# Patient Record
Sex: Male | Born: 1980 | Race: Black or African American | Marital: Married | Smoking: Never smoker
Health system: Southern US, Community
[De-identification: ages and names within clinical notes are randomized; demographics above are authoritative.]

## PROBLEM LIST (undated history)

## (undated) HISTORY — PX: APPENDECTOMY: SHX54

---

## 2020-02-21 ENCOUNTER — Encounter: Payer: Self-pay | Admitting: Emergency Medicine

## 2020-02-21 ENCOUNTER — Other Ambulatory Visit: Payer: Self-pay

## 2020-02-21 ENCOUNTER — Emergency Department
Admission: EM | Admit: 2020-02-21 | Discharge: 2020-02-21 | Disposition: A | Discharge status: Home / Self Care | Payer: BC Managed Care – PPO | Attending: Emergency Medicine | Admitting: Emergency Medicine

## 2020-02-21 ENCOUNTER — Emergency Department: Payer: BC Managed Care – PPO

## 2020-02-21 DIAGNOSIS — X509XXA Other and unspecified overexertion or strenuous movements or postures, initial encounter: Secondary | ICD-10-CM | POA: Insufficient documentation

## 2020-02-21 DIAGNOSIS — S29011A Strain of muscle and tendon of front wall of thorax, initial encounter: Secondary | ICD-10-CM | POA: Insufficient documentation

## 2020-02-21 DIAGNOSIS — S299XXA Unspecified injury of thorax, initial encounter: Secondary | ICD-10-CM | POA: Diagnosis present

## 2020-02-21 DIAGNOSIS — Y999 Unspecified external cause status: Secondary | ICD-10-CM | POA: Diagnosis not present

## 2020-02-21 DIAGNOSIS — T148XXA Other injury of unspecified body region, initial encounter: Secondary | ICD-10-CM

## 2020-02-21 DIAGNOSIS — Y9389 Activity, other specified: Secondary | ICD-10-CM | POA: Insufficient documentation

## 2020-02-21 DIAGNOSIS — Y929 Unspecified place or not applicable: Secondary | ICD-10-CM | POA: Insufficient documentation

## 2020-02-21 HISTORY — DX: Gilbert syndrome: E80.4

## 2020-02-21 MED ORDER — CYCLOBENZAPRINE HCL 5 MG PO TABS: ORAL_TABLET | ORAL | 0 refills | Status: AC

## 2020-02-21 MED ORDER — KETOROLAC TROMETHAMINE 30 MG/ML IJ SOLN
30.0000 mg | Freq: Once | INTRAMUSCULAR | Status: AC
  Administered 2020-02-21: 30 mg via INTRAMUSCULAR
  Filled 2020-02-21: qty 1

## 2020-02-21 MED ORDER — KETOROLAC TROMETHAMINE 10 MG PO TABS: 10.0000 mg | ORAL_TABLET | Freq: Four times a day (QID) | ORAL | 0 refills | Status: AC | PRN

## 2020-02-21 MED ORDER — LIDOCAINE 5 % EX PTCH: 1.0000 | MEDICATED_PATCH | CUTANEOUS | 0 refills | Status: AC

## 2020-02-21 NOTE — ED Provider Notes (Signed)
[facility] Emergency Department Provider Note  ____________________________________________  Time seen: Approximately 3:13 PM  I have reviewed the triage vital signs and the nursing notes.   HISTORY  Chief Complaint Muscle Pain    HPI Thomas Sanchez is a 39 y.o. male presents to the emergency department for evaluation of right chest wall pain just below his right collarbone for 2 months.  Patient states that he was pulling on a car motor when he felt a pull to his right chest wall.  He is concerned that he pulled the muscle here.  Pain has continued off and on since.  Pain is worse in the morning when he wakes up.  It is also worse when he tries to arch his shoulders backwards or when he turns his neck left or right.  He can elicit pain in the emergency department by pulling his shoulders backwards or by pulling his shoulders forwards.  If he is standing still, he denies any pain.  He has been taking and International cold and flu medication for pain.  He is unsure of the ingredients but states there is no anti-inflammatory in medication.  No shortness of breath,  Past Medical History:  Diagnosis Date  . Gilbert's syndrome     There are no problems to display for this patient.   History reviewed. No pertinent surgical history.  Prior to Admission medications   Medication Sig Start Date End Date Taking? Authorizing Provider  cyclobenzaprine (FLEXERIL) 5 MG tablet Take 1-2 tablets 3 times daily as needed 02/21/20   Enid Derry, PA-C  ketorolac (TORADOL) 10 MG tablet Take 1 tablet (10 mg total) by mouth every 6 (six) hours as needed. 02/21/20   Enid Derry, PA-C  lidocaine (LIDODERM) 5 % Place 1 patch onto the skin daily. Remove & Discard patch within 12 hours or as directed by MD 02/21/20   Enid Derry, PA-C    Allergies Patient has no known allergies.  History reviewed. No pertinent family history.  Social History Social History   Tobacco Use  .  Smoking status: Never Smoker  . Smokeless tobacco: Never Used  Substance Use Topics  . Alcohol use: Not on file  . Drug use: Not on file     Review of Systems  Constitutional: No fever/chills ENT: No upper respiratory complaints. Cardiovascular: Positive for chest wall pain. Respiratory: No cough. No SOB. Gastrointestinal: No abdominal pain.  No nausea, no vomiting.  Musculoskeletal: Negative for musculoskeletal pain. Skin: Negative for rash, abrasions, lacerations, ecchymosis. Neurological: Negative for headaches, numbness or tingling   ____________________________________________   PHYSICAL EXAM:  VITAL SIGNS: ED Triage Vitals  Enc Vitals Group     BP 02/21/20 1338 138/83     Pulse Rate 02/21/20 1338 91     Resp 02/21/20 1338 15     Temp 02/21/20 1338 98.2 F (36.8 C)     Temp Source 02/21/20 1338 Oral     SpO2 02/21/20 1338 99 %     Weight 02/21/20 1338 190 lb (86.2 kg)     Height 02/21/20 1338 6' (1.829 m)     Head Circumference --      Peak Flow --      Pain Score 02/21/20 1353 0     Pain Loc --      Pain Edu? --      Excl. in GC? --      Constitutional: Alert and oriented. Well appearing and in no acute distress. Eyes: Conjunctivae are normal. PERRL.  EOMI. Head: Atraumatic. ENT:      Ears:      Nose: No congestion/rhinnorhea.      Mouth/Throat: Mucous membranes are moist.  Neck: No stridor.  Cardiovascular: Normal rate, regular rhythm.  Good peripheral circulation. Respiratory: Normal respiratory effort without tachypnea or retractions. Lungs CTAB. Good air entry to the bases with no decreased or absent breath sounds. Musculoskeletal: Full range of motion to all extremities. No gross deformities appreciated.  Pain elicited with arching backwards and arching forwards.  Pain elicited with right range of motion of right shoulder.  Pain elicited with rotation of neck. Neurologic:  Normal speech and language. No gross focal neurologic deficits are appreciated.   Skin:  Skin is warm, dry and intact. No rash noted. Psychiatric: Mood and affect are normal. Speech and behavior are normal. Patient exhibits appropriate insight and judgement.   ____________________________________________   LABS (all labs ordered are listed, but only abnormal results are displayed)  Labs Reviewed - No data to display ____________________________________________  EKG   ____________________________________________  RADIOLOGY Lexine Baton, personally viewed and evaluated these images (plain radiographs) as part of my medical decision making, as well as reviewing the written report by the radiologist.  DG Chest 2 View  Result Date: 02/21/2020 CLINICAL DATA:  Chest pain. EXAM: CHEST - 2 VIEW COMPARISON:  None. FINDINGS: The heart size and mediastinal contours are within normal limits. Both lungs are clear. No pneumothorax or pleural effusion is noted. The visualized skeletal structures are unremarkable. IMPRESSION: No active cardiopulmonary disease. Electronically Signed   By: Lupita Raider M.D.   On: 02/21/2020 15:32    ____________________________________________    PROCEDURES  Procedure(s) performed:    Procedures    Medications  ketorolac (TORADOL) 30 MG/ML injection 30 mg (30 mg Intramuscular Given 02/21/20 1628)     ____________________________________________   INITIAL IMPRESSION / ASSESSMENT AND PLAN / ED COURSE  Pertinent labs & imaging results that were available during my care of the patient were reviewed by me and considered in my medical decision making (see chart for details).  Review of the [place] CSRS was performed in accordance of the [facility] prior to dispensing any controlled drugs.   Patient's diagnosis is consistent with musculoskeletal pain. Vital signs and exam are reassuring. Exam is consistent with a muscle strain. Pain is reproducible with movement.  Chest x-ray negative for acute cardiopulmonary processes.  Sinus rhythm on  EKG.  Patient was given IM Toradol for pain. Patient will be discharged home with prescriptions for flexeril and motrin. Patient is to follow up with PCP as directed. Patient is given ED precautions to return to the ED for any worsening or new symptoms.  Thomas Sanchez was evaluated in Emergency Department on 02/21/2020 for the symptoms described in the history of present illness. He was evaluated in the context of the global COVID-19 pandemic, which necessitated consideration that the patient might be at risk for infection with the SARS-CoV-2 virus that causes COVID-19. Institutional protocols and algorithms that pertain to the evaluation of patients at risk for COVID-19 are in a state of rapid change based on information released by regulatory bodies including the CDC and federal and state organizations. These policies and algorithms were followed during the patient's care in the ED.   ____________________________________________  FINAL CLINICAL IMPRESSION(S) / ED DIAGNOSES  Final diagnoses:  Muscle strain      NEW MEDICATIONS STARTED DURING THIS VISIT:  ED Discharge Orders  Ordered    ketorolac (TORADOL) 10 MG tablet  Every 6 hours PRN     02/21/20 1622    cyclobenzaprine (FLEXERIL) 5 MG tablet     02/21/20 1622    lidocaine (LIDODERM) 5 %  Every 24 hours     02/21/20 1622              This chart was dictated using voice recognition software/Dragon. Despite best efforts to proofread, errors can occur which can change the meaning. Any change was purely unintentional.    Laban Emperor, PA-C 02/21/20 2316    Drenda Freeze, MD 02/21/20 [phone]

## 2020-02-21 NOTE — ED Notes (Signed)
See triage note- pt states he strained his right sided pectoral muscle in January. Reports pain with movement.

## 2020-02-21 NOTE — ED Triage Notes (Signed)
Pt c/o neck pain that radiates down when he turns his head, states the also strained right pectoral muscle a few months ago. States the pain comes and goes.

## 2021-06-26 ENCOUNTER — Encounter: Payer: Self-pay | Admitting: Emergency Medicine

## 2021-06-26 ENCOUNTER — Emergency Department: Payer: 59

## 2021-06-26 ENCOUNTER — Other Ambulatory Visit: Payer: Self-pay

## 2021-06-26 DIAGNOSIS — R0789 Other chest pain: Secondary | ICD-10-CM | POA: Diagnosis present

## 2021-06-26 DIAGNOSIS — M25511 Pain in right shoulder: Secondary | ICD-10-CM | POA: Diagnosis not present

## 2021-06-26 LAB — CBC
HCT: 47.5 % (ref 39.0–52.0)
Hemoglobin: 17.4 g/dL — ABNORMAL HIGH (ref 13.0–17.0)
MCH: 29 pg (ref 26.0–34.0)
MCHC: 36.6 g/dL — ABNORMAL HIGH (ref 30.0–36.0)
MCV: 79 fL — ABNORMAL LOW (ref 80.0–100.0)
Platelets: 153 10*3/uL (ref 150–400)
RBC: 6.01 MIL/uL — ABNORMAL HIGH (ref 4.22–5.81)
RDW: 13.1 % (ref 11.5–15.5)
WBC: 6.3 10*3/uL (ref 4.0–10.5)
nRBC: 0 % (ref 0.0–0.2)

## 2021-06-26 LAB — BASIC METABOLIC PANEL
Anion gap: 9 (ref 5–15)
BUN: 10 mg/dL (ref 6–20)
CO2: 25 mmol/L (ref 22–32)
Calcium: 9.2 mg/dL (ref 8.9–10.3)
Chloride: 104 mmol/L (ref 98–111)
Creatinine, Ser: 1.25 mg/dL — ABNORMAL HIGH (ref 0.61–1.24)
GFR, Estimated: >60 mL/min (ref >60)
Glucose, Bld: 114 mg/dL — ABNORMAL HIGH (ref 70–99)
Potassium: 3.3 mmol/L — ABNORMAL LOW (ref 3.5–5.1)
Sodium: 138 mmol/L (ref 135–145)

## 2021-06-26 LAB — TROPONIN I (HIGH SENSITIVITY): Troponin I (High Sensitivity): 3 ng/L (ref <18)

## 2021-06-26 NOTE — ED Triage Notes (Signed)
Patient to ED for CP that started this AM. Patient also complaining on right shoulder pain that comes and goes depending on the posture. Patient ambulatory to triage.

## 2021-06-27 ENCOUNTER — Emergency Department
Admission: EM | Admit: 2021-06-27 | Discharge: 2021-06-27 | Disposition: A | Discharge status: Home / Self Care | Payer: 59 | Attending: Emergency Medicine | Admitting: Emergency Medicine

## 2021-06-27 DIAGNOSIS — R0789 Other chest pain: Secondary | ICD-10-CM

## 2021-06-27 DIAGNOSIS — M25511 Pain in right shoulder: Secondary | ICD-10-CM

## 2021-06-27 LAB — TROPONIN I (HIGH SENSITIVITY): Troponin I (High Sensitivity): 3 ng/L (ref <18)

## 2021-06-27 NOTE — ED Notes (Signed)
No chest pain right now.  No  Distress.  No sob.  Says the chest pain started yesterday

## 2021-06-27 NOTE — ED Provider Notes (Signed)
[facility] Emergency Department Provider Note   ____________________________________________   Event Date/Time   First MD Initiated Contact with Patient 06/27/21 [phone]     (approximate)  I have reviewed the triage vital signs and the nursing notes.   HISTORY  Chief Complaint Chest Pain    HPI Thomas Sanchez is a 40 y.o. 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 MODIFYING FACTORS: Denies any exacerbating or relieving factors ASSOCIATED SYMPTOMS: Intermittent right shoulder pain   Per medical record review, past medical history noncontributory          Past Medical History:  Diagnosis Date   Gilbert's syndrome     There are no problems to display for this patient.   Past Surgical History:  Procedure Laterality Date   APPENDECTOMY      Prior to Admission medications   Medication Sig Start Date End Date Taking? Authorizing Provider  cyclobenzaprine (FLEXERIL) 5 MG tablet Take 1-2 tablets 3 times daily as needed 02/21/20   Enid Derry, PA-C  ketorolac (TORADOL) 10 MG tablet Take 1 tablet (10 mg total) by mouth every 6 (six) hours as needed. 02/21/20   Enid Derry, PA-C  lidocaine (LIDODERM) 5 % Place 1 patch onto the skin daily. Remove & Discard patch within 12 hours or as directed by MD 02/21/20   Enid Derry, PA-C    Allergies Patient has no known allergies.  No family history on file.  Social History Social History   Tobacco Use   Smoking status: Never   Smokeless tobacco: Never  Vaping Use   Vaping Use: Never used  Substance Use Topics   Alcohol use: Not Currently   Drug use: Not Currently    Review of Systems Constitutional: No fever/chills Eyes: No visual  changes. ENT: No sore throat. Cardiovascular: Endorses chest pain. Respiratory: Denies shortness of breath. Gastrointestinal: No abdominal pain.  No nausea, no vomiting.  No diarrhea. Genitourinary: Negative for dysuria. Musculoskeletal: Positive for acute right shoulder pain Skin: Negative for rash. Neurological: Negative for headaches, weakness/numbness/paresthesias in any extremity Psychiatric: Negative for suicidal ideation/homicidal ideation   ____________________________________________   PHYSICAL EXAM:  VITAL SIGNS: ED Triage Vitals [06/26/21 2245]  Enc Vitals Group     BP 140/87     Pulse Rate 66     Resp 18     Temp 98 F (36.7 C)     Temp Source Oral     SpO2 99 %     Weight 210 lb (95.3 kg)     Height 6' (1.829 m)     Head Circumference      Peak Flow      Pain Score 0     Pain Loc      Pain Edu?      Excl. in GC?    Constitutional: Alert and oriented. Well appearing and in no acute distress. Eyes: Conjunctivae are normal. PERRL. Head: Atraumatic. Nose: No congestion/rhinnorhea. Mouth/Throat: Mucous membranes are moist. Neck: No stridor Cardiovascular: Grossly normal heart sounds.  Good peripheral circulation. Respiratory: Normal respiratory effort.  No retractions. Gastrointestinal: Soft and nontender. No distention. Musculoskeletal: No obvious deformities.  Right shoulder range of motion intact without pain.  No tenderness to palpation surrounding the joint, distally neurovascularly intact Neurologic:  Normal speech  and language. No gross focal neurologic deficits are appreciated. Skin:  Skin is warm and dry. No rash noted. Psychiatric: Mood and affect are normal. Speech and behavior are normal.  ____________________________________________   LABS (all labs ordered are listed, but only abnormal results are displayed)  Labs Reviewed  BASIC METABOLIC PANEL - Abnormal; Notable for the following components:      Result Value   Potassium 3.3 (*)     Glucose, Bld 114 (*)    Creatinine, Ser 1.25 (*)    All other components within normal limits  CBC - Abnormal; Notable for the following components:   RBC 6.01 (*)    Hemoglobin 17.4 (*)    MCV 79.0 (*)    MCHC 36.6 (*)    All other components within normal limits  TROPONIN I (HIGH SENSITIVITY)  TROPONIN I (HIGH SENSITIVITY)   ____________________________________________  EKG  ED ECG REPORT I, Merwyn Katos, the attending physician, personally viewed and interpreted this ECG.  Date: 06/27/2021 EKG Time: 2251 Rate: 82 Rhythm: normal sinus rhythm QRS Axis: normal Intervals: normal ST/T Wave abnormalities: normal Narrative Interpretation: no evidence of acute ischemia  ____________________________________________  RADIOLOGY  ED MD interpretation: 2 view chest x-ray shows no evidence of acute abnormalities including no pneumonia, pneumothorax, or widened mediastinum  Official radiology report(s): DG Chest 2 View  Result Date: 06/26/2021 CLINICAL DATA:  Chest pain EXAM: CHEST - 2 VIEW COMPARISON:  02/21/2020 FINDINGS: The heart size and mediastinal contours are within normal limits. Both lungs are clear. The visualized skeletal structures are unremarkable. IMPRESSION: No active cardiopulmonary disease. Electronically Signed   By: Deatra Robinson M.D.   On: 06/26/2021 23:30    ____________________________________________   PROCEDURES  Procedure(s) performed (including Critical Care):  .1-3 Lead EKG Interpretation  Date/Time: 06/27/2021 7:58 AM Performed by: Merwyn Katos, MD Authorized by: Merwyn Katos, MD     Interpretation: normal     ECG rate:  65   ECG rate assessment: normal     Rhythm: sinus rhythm     Ectopy: none     Conduction: normal     ____________________________________________   INITIAL IMPRESSION / ASSESSMENT AND PLAN / ED COURSE  As part of my medical decision making, I reviewed the following data within the electronic medical record, if  available:  Nursing notes reviewed and incorporated, Labs reviewed, EKG interpreted, Old chart reviewed, Radiograph reviewed and Notes from prior ED visits reviewed and incorporated      Workup: ECG, CXR, CBC, BMP, Troponin Findings: ECG: No overt evidence of STEMI. No evidence of Brugadas sign, delta wave, epsilon wave, significantly prolonged QTc, or malignant arrhythmia HS Troponin: Negative x1 Other Labs unremarkable for emergent problems. CXR: Without PTX, PNA, or widened mediastinum Last Stress Test: Never Last Heart Catheterization: Never HEART Score: 1  Given History, Exam, and Workup I have low suspicion for ACS, Pneumothorax, Pneumonia, Pulmonary Embolus, Tamponade, Aortic Dissection or other emergent problem as a cause for this presentation.   Reassesment: Prior to discharge patients pain was controlled and they were well appearing.  Disposition:  Discharge. Strict return precautions discussed with patient with full understanding. Advised patient to follow up promptly with primary care provider       ____________________________________________   FINAL CLINICAL IMPRESSION(S) / ED DIAGNOSES  Final diagnoses:  Chest wall pain  Acute pain of right shoulder     ED Discharge Orders     None        Note:  This  document was prepared using Conservation officer, historic buildings and may include unintentional dictation errors.    Merwyn Katos, MD 06/27/21 [phone]

## 2021-07-01 IMAGING — CR DG CHEST 2V
2 series · 2 of 2 positions shown · non-contrast
Comparison: None.

CLINICAL DATA: Chest pain.

EXAM:
CHEST - 2 VIEW

[chest pa]
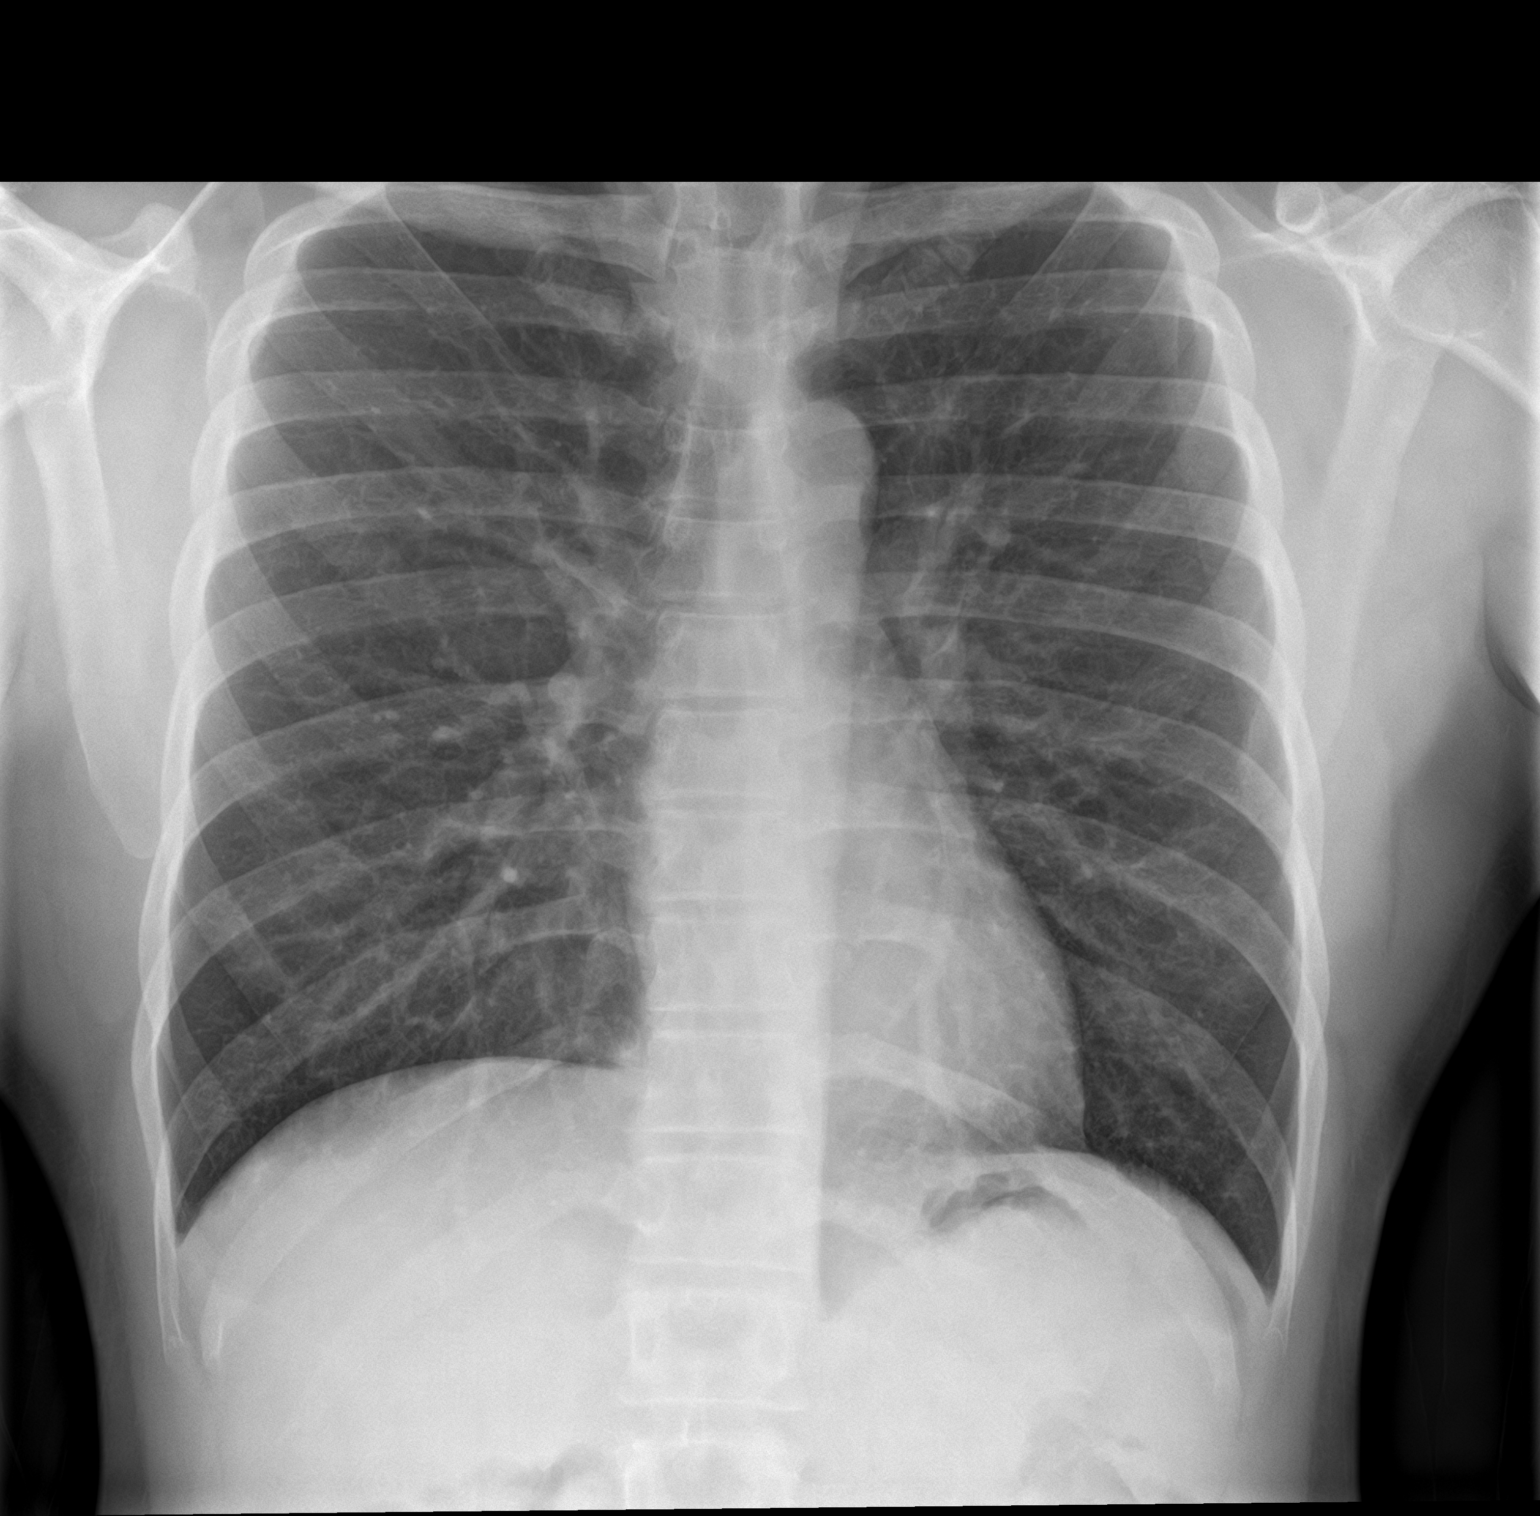

[chest lat]
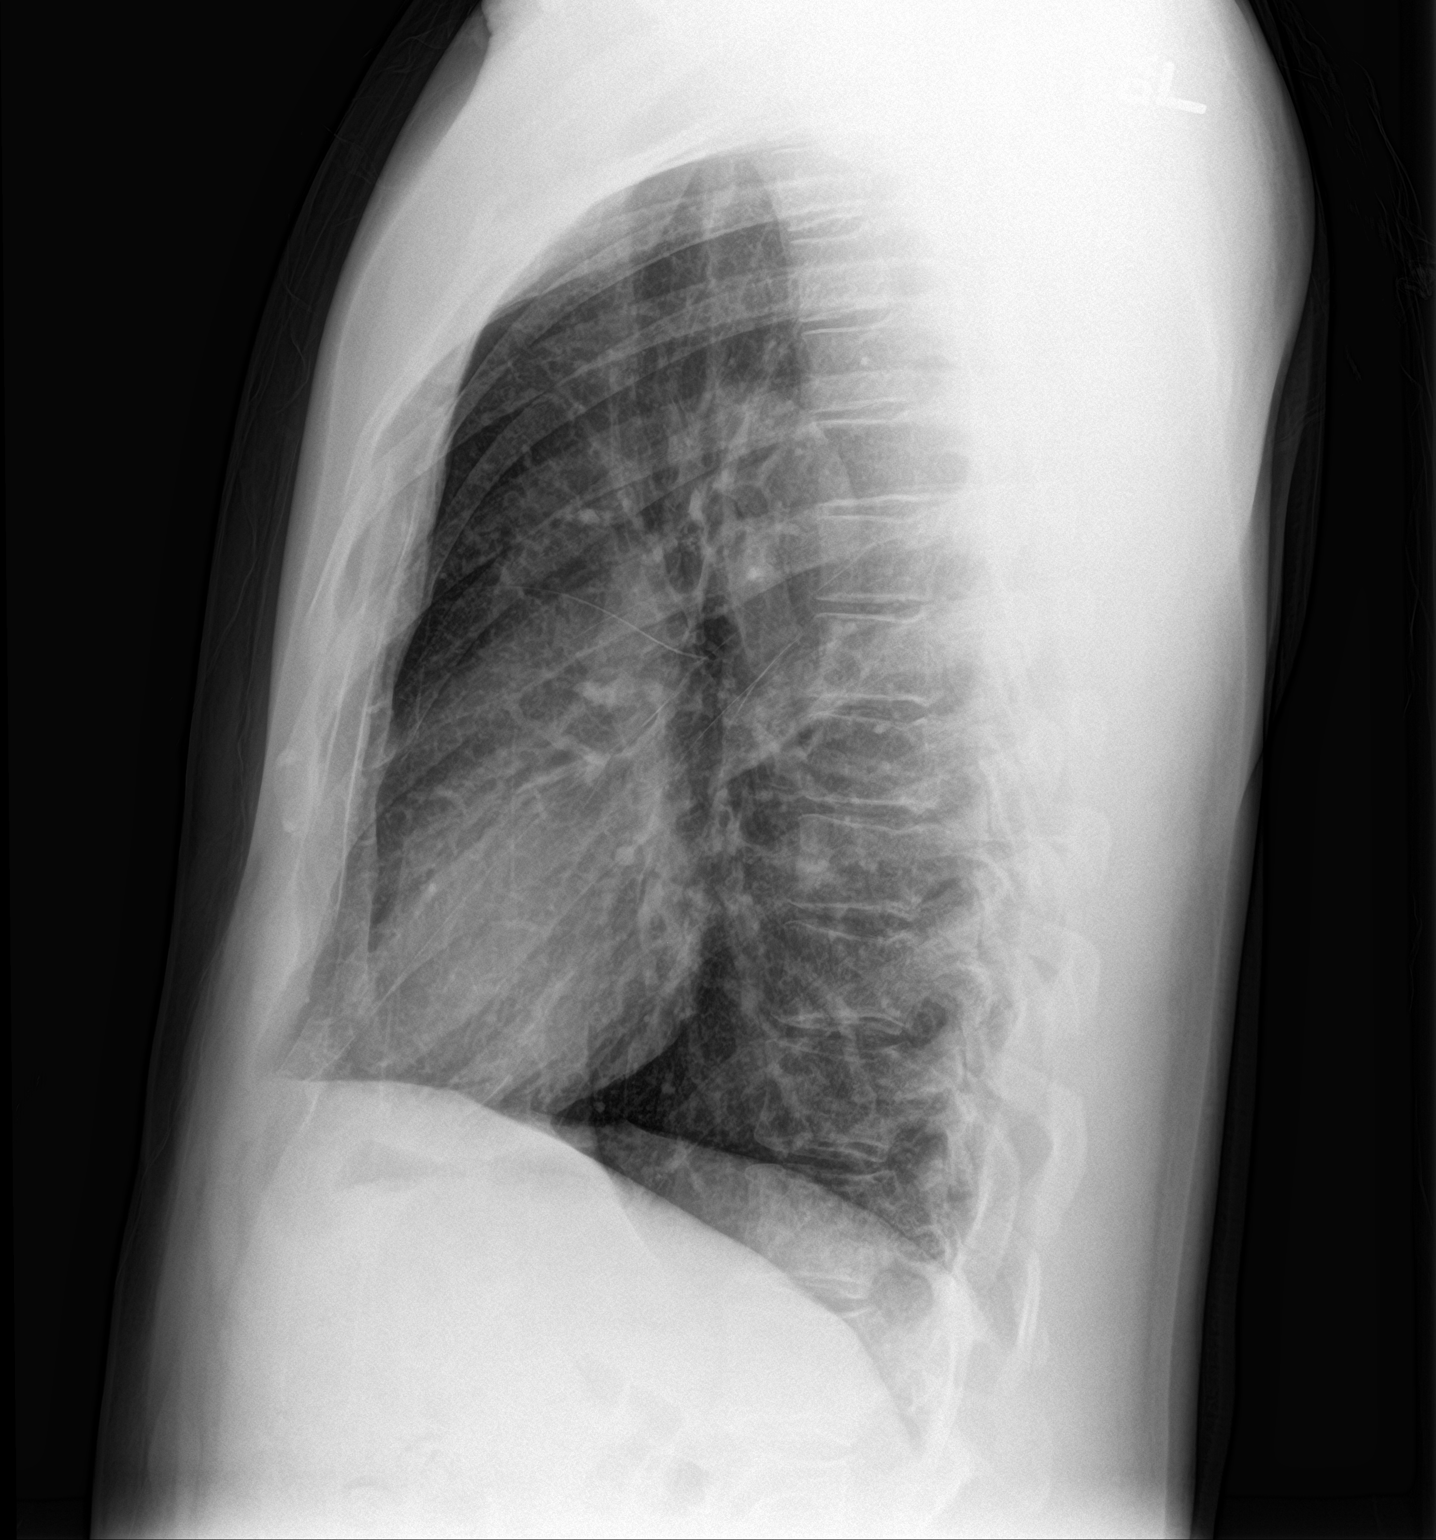

[2 of 2 positions shown; findings below may reference images not displayed]

FINDINGS: The heart size and mediastinal contours are within normal limits.
Both lungs are clear. No pneumothorax or pleural effusion is noted.
The visualized skeletal structures are unremarkable.
IMPRESSION: No active cardiopulmonary disease.

## 2022-11-04 IMAGING — CR DG CHEST 2V
2 series · 2 of 2 positions shown · non-contrast
Comparison: 02/21/2020

CLINICAL DATA: Chest pain

EXAM:
CHEST - 2 VIEW

[chest pa]
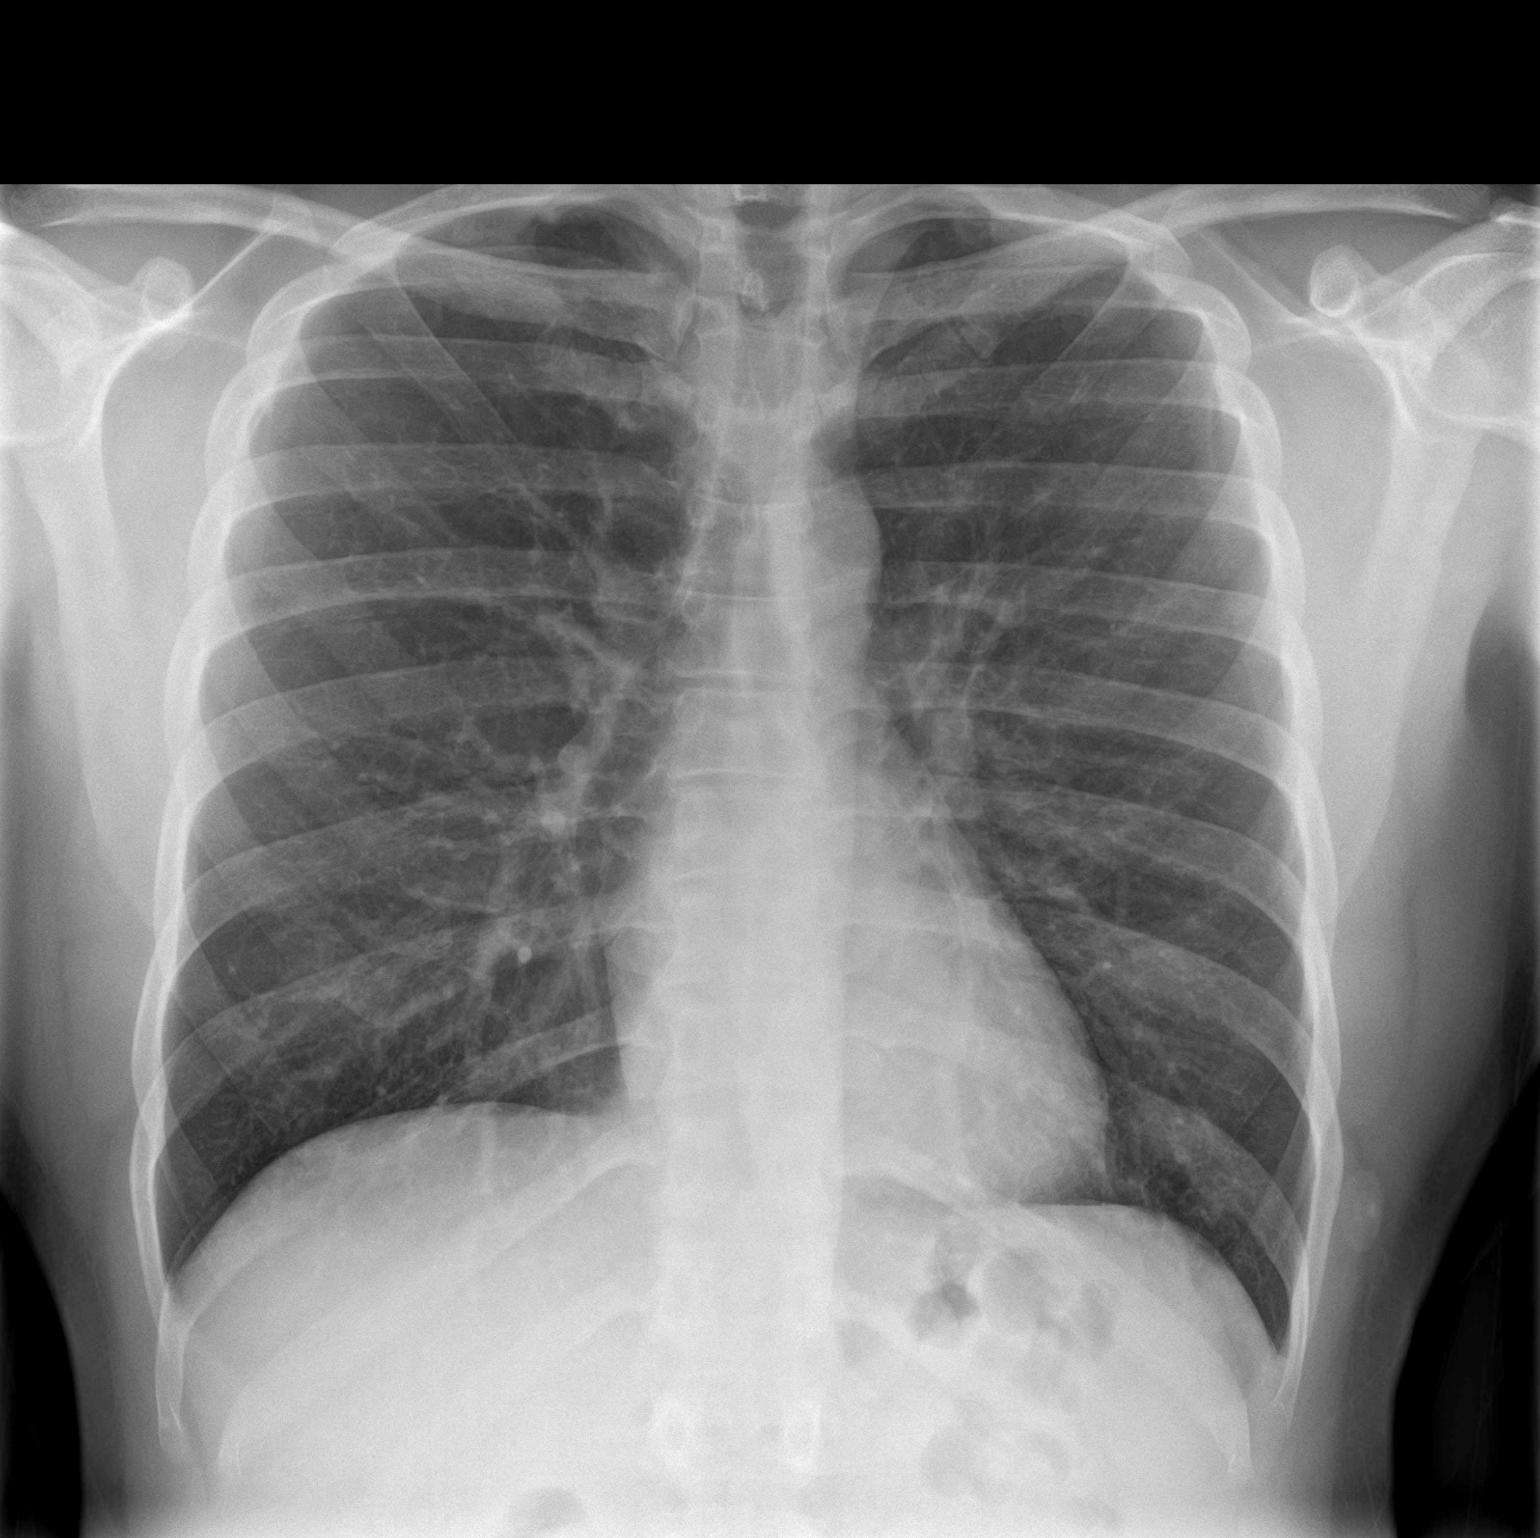

[chest lat]
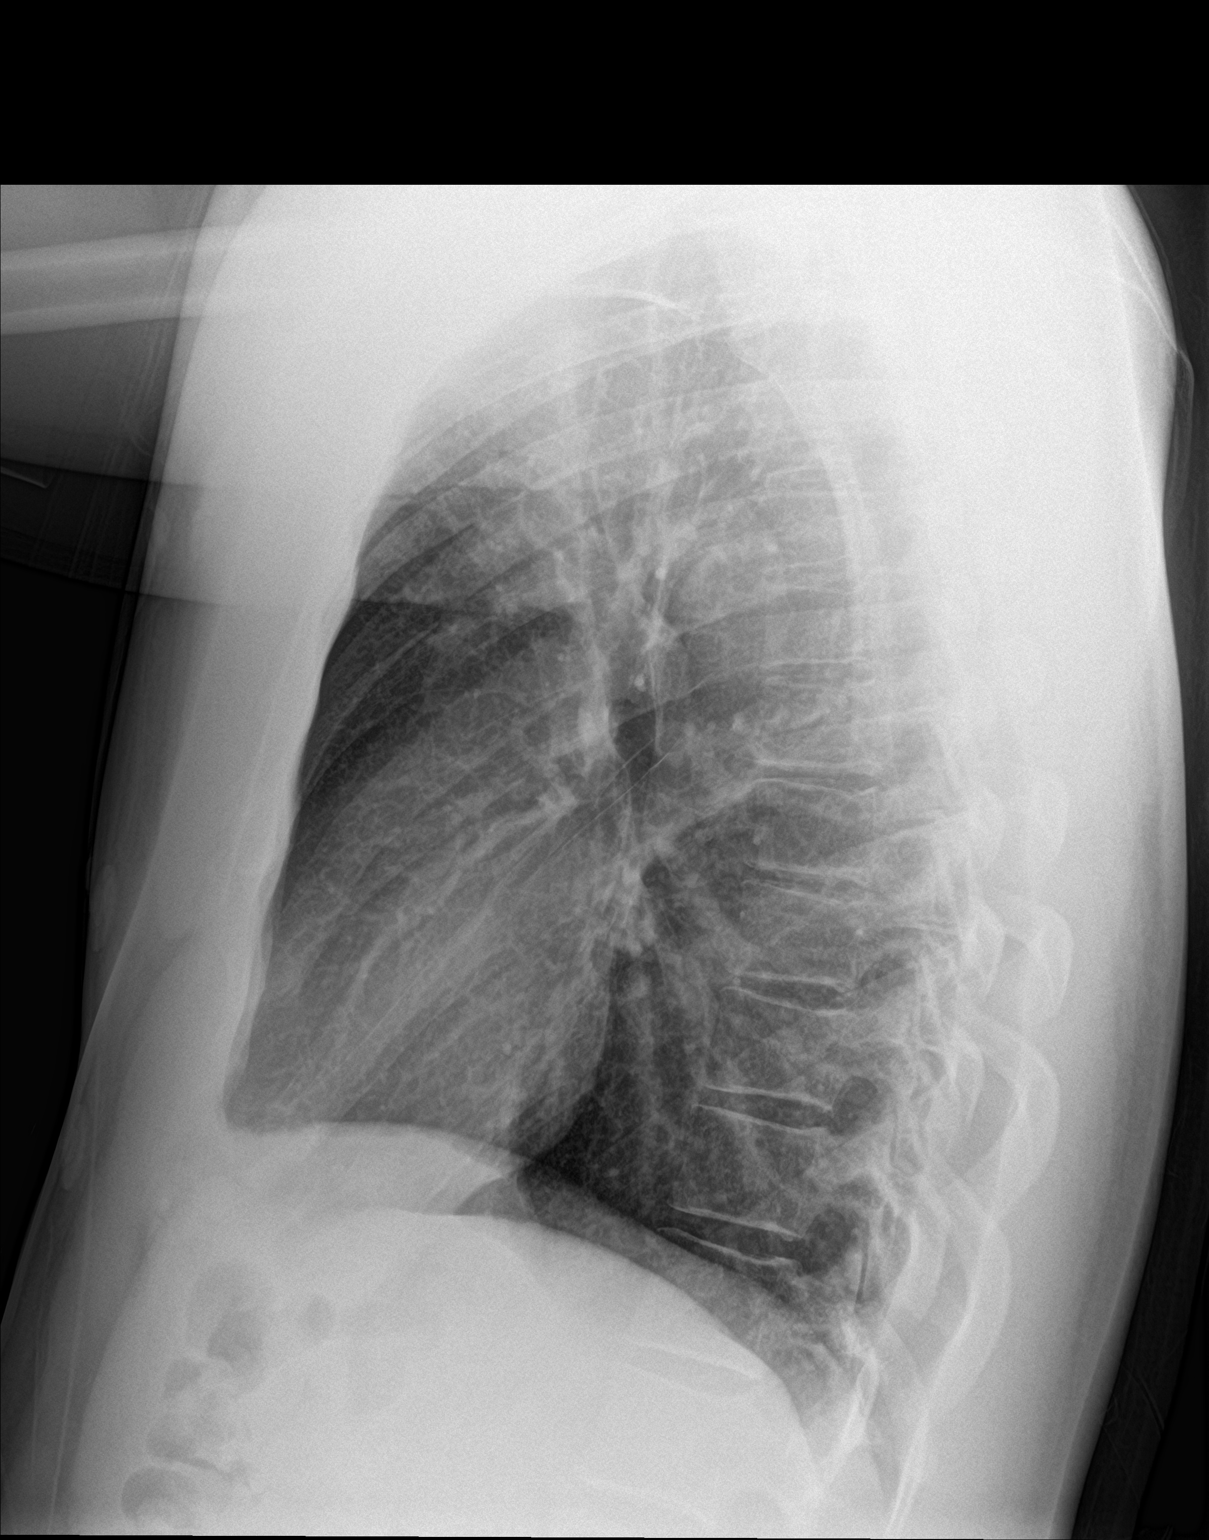

[2 of 2 positions shown; findings below may reference images not displayed]

FINDINGS: The heart size and mediastinal contours are within normal limits.
Both lungs are clear. The visualized skeletal structures are
unremarkable.
IMPRESSION: No active cardiopulmonary disease.
